# Patient Record
Sex: Female | Born: 1970 | Race: White | Hispanic: No | Marital: Married | Smoking: Never smoker
Health system: Southern US, Community
[De-identification: ages and names within clinical notes are randomized; demographics above are authoritative.]

---

## 1998-07-27 ENCOUNTER — Other Ambulatory Visit: Admission: RE | Admit: 1998-07-27 | Discharge: 1998-07-27 | Payer: Self-pay | Admitting: *Deleted

## 1999-09-02 ENCOUNTER — Other Ambulatory Visit: Admission: RE | Admit: 1999-09-02 | Discharge: 1999-09-02 | Payer: Self-pay | Admitting: *Deleted

## 2001-11-29 ENCOUNTER — Other Ambulatory Visit: Admission: RE | Admit: 2001-11-29 | Discharge: 2001-11-29 | Payer: Self-pay | Admitting: Obstetrics and Gynecology

## 2003-05-01 LAB — TSH: TSH: 1.37 (ref 0.41–5.90)

## 2003-05-27 ENCOUNTER — Other Ambulatory Visit: Admission: RE | Admit: 2003-05-27 | Discharge: 2003-05-27 | Payer: Self-pay | Admitting: Obstetrics and Gynecology

## 2003-09-16 LAB — POCT WET PREP (WET MOUNT)
Trichomonas Wet Prep HPF POC: ABSENT
WBC, Wet Prep HPF POC: NORMAL

## 2003-12-23 ENCOUNTER — Inpatient Hospital Stay (HOSPITAL_COMMUNITY): Admission: AD | Admit: 2003-12-23 | Discharge: 2003-12-26 | Payer: Self-pay | Admitting: Obstetrics and Gynecology

## 2003-12-23 ENCOUNTER — Inpatient Hospital Stay (HOSPITAL_COMMUNITY): Admission: AD | Admit: 2003-12-23 | Discharge: 2003-12-23 | Payer: Self-pay | Admitting: Obstetrics and Gynecology

## 2004-06-14 ENCOUNTER — Other Ambulatory Visit: Admission: RE | Admit: 2004-06-14 | Discharge: 2004-06-14 | Payer: Self-pay | Admitting: Obstetrics and Gynecology

## 2004-07-28 ENCOUNTER — Encounter: Admission: RE | Admit: 2004-07-28 | Discharge: 2004-07-28 | Payer: Self-pay | Admitting: Gastroenterology

## 2005-06-15 ENCOUNTER — Other Ambulatory Visit: Admission: RE | Admit: 2005-06-15 | Discharge: 2005-06-15 | Payer: Self-pay | Admitting: Obstetrics and Gynecology

## 2006-06-26 ENCOUNTER — Other Ambulatory Visit: Admission: RE | Admit: 2006-06-26 | Discharge: 2006-06-26 | Payer: Self-pay | Admitting: Obstetrics and Gynecology

## 2006-08-04 ENCOUNTER — Emergency Department (HOSPITAL_COMMUNITY): Admission: EM | Admit: 2006-08-04 | Discharge: 2006-08-04 | Payer: Self-pay | Admitting: Family Medicine

## 2006-08-14 ENCOUNTER — Emergency Department (HOSPITAL_COMMUNITY): Admission: EM | Admit: 2006-08-14 | Discharge: 2006-08-14 | Payer: Self-pay | Admitting: Family Medicine

## 2006-11-03 ENCOUNTER — Encounter: Admission: RE | Admit: 2006-11-03 | Discharge: 2006-11-03 | Payer: Self-pay | Admitting: Obstetrics and Gynecology

## 2007-12-05 ENCOUNTER — Other Ambulatory Visit: Admission: RE | Admit: 2007-12-05 | Discharge: 2007-12-05 | Payer: Self-pay | Admitting: Obstetrics and Gynecology

## 2007-12-05 LAB — HM PAP SMEAR: HM Pap smear: NEGATIVE

## 2008-05-12 ENCOUNTER — Encounter: Admission: RE | Admit: 2008-05-12 | Discharge: 2008-05-12 | Payer: Self-pay | Admitting: Family Medicine

## 2009-01-14 ENCOUNTER — Encounter: Admission: RE | Admit: 2009-01-14 | Discharge: 2009-01-14 | Payer: Self-pay | Admitting: Obstetrics and Gynecology

## 2009-01-26 ENCOUNTER — Encounter: Admission: RE | Admit: 2009-01-26 | Discharge: 2009-01-26 | Payer: Self-pay | Admitting: Obstetrics and Gynecology

## 2009-04-20 ENCOUNTER — Other Ambulatory Visit: Admission: RE | Admit: 2009-04-20 | Discharge: 2009-04-20 | Payer: Self-pay | Admitting: Obstetrics and Gynecology

## 2010-01-09 ENCOUNTER — Emergency Department (HOSPITAL_COMMUNITY)
Admission: EM | Admit: 2010-01-09 | Discharge: 2010-01-09 | Payer: Self-pay | Source: Home / Self Care | Admitting: Emergency Medicine

## 2010-01-26 ENCOUNTER — Encounter
Admission: RE | Admit: 2010-01-26 | Discharge: 2010-01-26 | Payer: Self-pay | Source: Home / Self Care | Attending: Obstetrics and Gynecology | Admitting: Obstetrics and Gynecology

## 2010-02-14 ENCOUNTER — Encounter: Payer: Self-pay | Admitting: Obstetrics and Gynecology

## 2010-04-21 ENCOUNTER — Other Ambulatory Visit: Payer: Self-pay | Admitting: Obstetrics and Gynecology

## 2010-04-21 ENCOUNTER — Other Ambulatory Visit (HOSPITAL_COMMUNITY)
Admission: RE | Admit: 2010-04-21 | Discharge: 2010-04-21 | Disposition: A | Discharge status: Home / Self Care | Payer: BC Managed Care – PPO | Source: Ambulatory Visit | Attending: Obstetrics and Gynecology | Admitting: Obstetrics and Gynecology

## 2010-04-21 DIAGNOSIS — Z113 Encounter for screening for infections with a predominantly sexual mode of transmission: Secondary | ICD-10-CM | POA: Insufficient documentation

## 2010-04-21 DIAGNOSIS — Z01419 Encounter for gynecological examination (general) (routine) without abnormal findings: Secondary | ICD-10-CM | POA: Insufficient documentation

## 2011-02-17 ENCOUNTER — Other Ambulatory Visit: Payer: Self-pay | Admitting: Obstetrics and Gynecology

## 2011-02-17 DIAGNOSIS — Z1231 Encounter for screening mammogram for malignant neoplasm of breast: Secondary | ICD-10-CM

## 2011-03-01 ENCOUNTER — Ambulatory Visit
Admission: RE | Admit: 2011-03-01 | Discharge: 2011-03-01 | Disposition: A | Discharge status: Home / Self Care | Payer: BC Managed Care – PPO | Source: Ambulatory Visit | Attending: Obstetrics and Gynecology | Admitting: Obstetrics and Gynecology

## 2011-03-01 DIAGNOSIS — Z1231 Encounter for screening mammogram for malignant neoplasm of breast: Secondary | ICD-10-CM

## 2011-05-10 ENCOUNTER — Other Ambulatory Visit: Payer: Self-pay | Admitting: Obstetrics and Gynecology

## 2011-05-10 ENCOUNTER — Other Ambulatory Visit (HOSPITAL_COMMUNITY)
Admission: RE | Admit: 2011-05-10 | Discharge: 2011-05-10 | Disposition: A | Discharge status: Home / Self Care | Payer: BC Managed Care – PPO | Source: Ambulatory Visit | Attending: Obstetrics and Gynecology | Admitting: Obstetrics and Gynecology

## 2011-05-10 DIAGNOSIS — Z01419 Encounter for gynecological examination (general) (routine) without abnormal findings: Secondary | ICD-10-CM | POA: Insufficient documentation

## 2012-04-06 ENCOUNTER — Other Ambulatory Visit: Payer: Self-pay

## 2012-04-06 DIAGNOSIS — Z1231 Encounter for screening mammogram for malignant neoplasm of breast: Secondary | ICD-10-CM

## 2012-05-03 ENCOUNTER — Ambulatory Visit
Admission: RE | Admit: 2012-05-03 | Discharge: 2012-05-03 | Disposition: A | Discharge status: Home / Self Care | Payer: 59 | Source: Ambulatory Visit

## 2012-05-03 DIAGNOSIS — Z1231 Encounter for screening mammogram for malignant neoplasm of breast: Secondary | ICD-10-CM

## 2012-05-09 ENCOUNTER — Other Ambulatory Visit: Payer: Self-pay | Admitting: Obstetrics and Gynecology

## 2012-05-09 ENCOUNTER — Other Ambulatory Visit (HOSPITAL_COMMUNITY)
Admission: RE | Admit: 2012-05-09 | Discharge: 2012-05-09 | Disposition: A | Discharge status: Home / Self Care | Payer: 59 | Source: Ambulatory Visit | Attending: Obstetrics and Gynecology | Admitting: Obstetrics and Gynecology

## 2012-05-09 DIAGNOSIS — Z1151 Encounter for screening for human papillomavirus (HPV): Secondary | ICD-10-CM | POA: Insufficient documentation

## 2012-05-09 DIAGNOSIS — Z01419 Encounter for gynecological examination (general) (routine) without abnormal findings: Secondary | ICD-10-CM | POA: Insufficient documentation

## 2013-05-14 ENCOUNTER — Other Ambulatory Visit: Payer: Self-pay

## 2013-05-14 DIAGNOSIS — Z1231 Encounter for screening mammogram for malignant neoplasm of breast: Secondary | ICD-10-CM

## 2013-05-27 ENCOUNTER — Ambulatory Visit
Admission: RE | Admit: 2013-05-27 | Discharge: 2013-05-27 | Disposition: A | Discharge status: Home / Self Care | Payer: 59 | Source: Ambulatory Visit

## 2013-05-27 ENCOUNTER — Encounter (INDEPENDENT_AMBULATORY_CARE_PROVIDER_SITE_OTHER): Payer: Self-pay

## 2013-05-27 DIAGNOSIS — Z1231 Encounter for screening mammogram for malignant neoplasm of breast: Secondary | ICD-10-CM

## 2014-08-25 ENCOUNTER — Other Ambulatory Visit: Payer: Self-pay

## 2014-08-25 DIAGNOSIS — Z1231 Encounter for screening mammogram for malignant neoplasm of breast: Secondary | ICD-10-CM

## 2014-08-28 ENCOUNTER — Ambulatory Visit
Admission: RE | Admit: 2014-08-28 | Discharge: 2014-08-28 | Disposition: A | Discharge status: Home / Self Care | Payer: 59 | Source: Ambulatory Visit

## 2014-08-28 DIAGNOSIS — Z1231 Encounter for screening mammogram for malignant neoplasm of breast: Secondary | ICD-10-CM

## 2015-06-19 ENCOUNTER — Other Ambulatory Visit (HOSPITAL_COMMUNITY)
Admission: RE | Admit: 2015-06-19 | Discharge: 2015-06-19 | Disposition: A | Discharge status: Home / Self Care | Payer: 59 | Source: Ambulatory Visit | Attending: Obstetrics and Gynecology | Admitting: Obstetrics and Gynecology

## 2015-06-19 ENCOUNTER — Other Ambulatory Visit: Payer: Self-pay | Admitting: Obstetrics and Gynecology

## 2015-06-19 DIAGNOSIS — Z1151 Encounter for screening for human papillomavirus (HPV): Secondary | ICD-10-CM | POA: Insufficient documentation

## 2015-06-19 DIAGNOSIS — Z01419 Encounter for gynecological examination (general) (routine) without abnormal findings: Secondary | ICD-10-CM | POA: Diagnosis present

## 2015-06-23 LAB — CYTOLOGY - PAP

## 2015-10-22 ENCOUNTER — Other Ambulatory Visit: Payer: Self-pay | Admitting: Obstetrics and Gynecology

## 2015-10-22 DIAGNOSIS — Z1231 Encounter for screening mammogram for malignant neoplasm of breast: Secondary | ICD-10-CM

## 2015-10-27 ENCOUNTER — Ambulatory Visit
Admission: RE | Admit: 2015-10-27 | Discharge: 2015-10-27 | Disposition: A | Discharge status: Home / Self Care | Payer: 59 | Source: Ambulatory Visit | Attending: Obstetrics and Gynecology | Admitting: Obstetrics and Gynecology

## 2015-10-27 DIAGNOSIS — Z1231 Encounter for screening mammogram for malignant neoplasm of breast: Secondary | ICD-10-CM

## 2016-12-13 ENCOUNTER — Other Ambulatory Visit: Payer: Self-pay | Admitting: Obstetrics and Gynecology

## 2016-12-13 DIAGNOSIS — Z139 Encounter for screening, unspecified: Secondary | ICD-10-CM

## 2017-01-16 ENCOUNTER — Ambulatory Visit
Admission: RE | Admit: 2017-01-16 | Discharge: 2017-01-16 | Disposition: A | Discharge status: Home / Self Care | Payer: 59 | Source: Ambulatory Visit | Attending: Obstetrics and Gynecology | Admitting: Obstetrics and Gynecology

## 2017-01-16 DIAGNOSIS — Z139 Encounter for screening, unspecified: Secondary | ICD-10-CM

## 2018-09-25 ENCOUNTER — Other Ambulatory Visit (HOSPITAL_COMMUNITY)
Admission: RE | Admit: 2018-09-25 | Discharge: 2018-09-25 | Disposition: A | Discharge status: Home / Self Care | Payer: 59 | Source: Ambulatory Visit | Attending: Obstetrics and Gynecology | Admitting: Obstetrics and Gynecology

## 2018-09-25 ENCOUNTER — Other Ambulatory Visit: Payer: Self-pay | Admitting: Obstetrics and Gynecology

## 2018-09-25 DIAGNOSIS — Z01419 Encounter for gynecological examination (general) (routine) without abnormal findings: Secondary | ICD-10-CM | POA: Insufficient documentation

## 2018-09-26 LAB — CYTOLOGY - PAP
Diagnosis: NEGATIVE
HPV: NOT DETECTED

## 2018-12-28 ENCOUNTER — Other Ambulatory Visit: Payer: Self-pay | Admitting: Obstetrics and Gynecology

## 2018-12-28 DIAGNOSIS — Z1231 Encounter for screening mammogram for malignant neoplasm of breast: Secondary | ICD-10-CM

## 2019-01-05 ENCOUNTER — Ambulatory Visit
Admission: RE | Admit: 2019-01-05 | Discharge: 2019-01-05 | Disposition: A | Discharge status: Home / Self Care | Payer: 59 | Source: Ambulatory Visit

## 2019-01-05 ENCOUNTER — Other Ambulatory Visit: Payer: Self-pay

## 2019-01-05 DIAGNOSIS — Z1231 Encounter for screening mammogram for malignant neoplasm of breast: Secondary | ICD-10-CM

## 2019-04-17 LAB — HM COLONOSCOPY

## 2019-09-26 DIAGNOSIS — Z01419 Encounter for gynecological examination (general) (routine) without abnormal findings: Secondary | ICD-10-CM | POA: Diagnosis not present

## 2019-09-26 DIAGNOSIS — N939 Abnormal uterine and vaginal bleeding, unspecified: Secondary | ICD-10-CM | POA: Diagnosis not present

## 2019-09-26 DIAGNOSIS — N926 Irregular menstruation, unspecified: Secondary | ICD-10-CM | POA: Diagnosis not present

## 2019-09-26 LAB — TSH: TSH: 1.86 (ref 0.41–5.90)

## 2019-10-08 DIAGNOSIS — N950 Postmenopausal bleeding: Secondary | ICD-10-CM | POA: Diagnosis not present

## 2019-10-08 DIAGNOSIS — D72819 Decreased white blood cell count, unspecified: Secondary | ICD-10-CM | POA: Diagnosis not present

## 2019-10-22 DIAGNOSIS — D72819 Decreased white blood cell count, unspecified: Secondary | ICD-10-CM | POA: Diagnosis not present

## 2020-01-29 DIAGNOSIS — D2272 Melanocytic nevi of left lower limb, including hip: Secondary | ICD-10-CM | POA: Diagnosis not present

## 2020-01-29 DIAGNOSIS — D1801 Hemangioma of skin and subcutaneous tissue: Secondary | ICD-10-CM | POA: Diagnosis not present

## 2020-01-29 DIAGNOSIS — D225 Melanocytic nevi of trunk: Secondary | ICD-10-CM | POA: Diagnosis not present

## 2020-01-29 DIAGNOSIS — L738 Other specified follicular disorders: Secondary | ICD-10-CM | POA: Diagnosis not present

## 2020-03-20 ENCOUNTER — Other Ambulatory Visit: Payer: Self-pay | Admitting: Obstetrics and Gynecology

## 2020-03-20 DIAGNOSIS — Z1231 Encounter for screening mammogram for malignant neoplasm of breast: Secondary | ICD-10-CM

## 2020-04-23 ENCOUNTER — Other Ambulatory Visit: Payer: Self-pay

## 2020-04-23 ENCOUNTER — Ambulatory Visit
Admission: RE | Admit: 2020-04-23 | Discharge: 2020-04-23 | Disposition: A | Discharge status: Home / Self Care | Payer: 59 | Source: Ambulatory Visit

## 2020-04-23 DIAGNOSIS — Z1231 Encounter for screening mammogram for malignant neoplasm of breast: Secondary | ICD-10-CM | POA: Diagnosis not present

## 2020-04-27 ENCOUNTER — Other Ambulatory Visit: Payer: Self-pay | Admitting: Obstetrics and Gynecology

## 2020-04-27 DIAGNOSIS — R928 Other abnormal and inconclusive findings on diagnostic imaging of breast: Secondary | ICD-10-CM

## 2020-05-01 ENCOUNTER — Ambulatory Visit
Admission: RE | Admit: 2020-05-01 | Discharge: 2020-05-01 | Disposition: A | Discharge status: Home / Self Care | Payer: BC Managed Care – PPO | Source: Ambulatory Visit | Attending: Obstetrics and Gynecology | Admitting: Obstetrics and Gynecology

## 2020-05-01 ENCOUNTER — Other Ambulatory Visit: Payer: Self-pay

## 2020-05-01 DIAGNOSIS — N6002 Solitary cyst of left breast: Secondary | ICD-10-CM | POA: Diagnosis not present

## 2020-05-01 DIAGNOSIS — R928 Other abnormal and inconclusive findings on diagnostic imaging of breast: Secondary | ICD-10-CM

## 2020-12-23 ENCOUNTER — Other Ambulatory Visit: Payer: Self-pay | Admitting: Obstetrics and Gynecology

## 2020-12-23 DIAGNOSIS — N6321 Unspecified lump in the left breast, upper outer quadrant: Secondary | ICD-10-CM

## 2021-02-01 ENCOUNTER — Other Ambulatory Visit: Payer: Self-pay | Admitting: Obstetrics and Gynecology

## 2021-02-01 DIAGNOSIS — Z1231 Encounter for screening mammogram for malignant neoplasm of breast: Secondary | ICD-10-CM

## 2021-02-03 ENCOUNTER — Other Ambulatory Visit: Payer: BC Managed Care – PPO

## 2021-04-23 ENCOUNTER — Ambulatory Visit
Admission: RE | Admit: 2021-04-23 | Discharge: 2021-04-23 | Disposition: A | Discharge status: Home / Self Care | Payer: No Typology Code available for payment source | Source: Ambulatory Visit | Attending: Obstetrics and Gynecology | Admitting: Obstetrics and Gynecology

## 2021-04-23 DIAGNOSIS — Z1231 Encounter for screening mammogram for malignant neoplasm of breast: Secondary | ICD-10-CM

## 2021-05-25 ENCOUNTER — Other Ambulatory Visit (HOSPITAL_COMMUNITY)
Admission: RE | Admit: 2021-05-25 | Discharge: 2021-05-25 | Disposition: A | Discharge status: Home / Self Care | Payer: No Typology Code available for payment source | Source: Ambulatory Visit | Attending: Obstetrics and Gynecology | Admitting: Obstetrics and Gynecology

## 2021-05-25 DIAGNOSIS — Z01419 Encounter for gynecological examination (general) (routine) without abnormal findings: Secondary | ICD-10-CM | POA: Insufficient documentation

## 2021-05-28 LAB — CYTOLOGY - PAP
Comment: NEGATIVE
Diagnosis: UNDETERMINED — AB
High risk HPV: NEGATIVE

## 2022-03-14 ENCOUNTER — Other Ambulatory Visit: Payer: Self-pay | Admitting: Obstetrics and Gynecology

## 2022-03-14 DIAGNOSIS — Z1231 Encounter for screening mammogram for malignant neoplasm of breast: Secondary | ICD-10-CM

## 2022-03-19 DIAGNOSIS — Z6823 Body mass index (BMI) 23.0-23.9, adult: Secondary | ICD-10-CM | POA: Diagnosis not present

## 2022-03-19 DIAGNOSIS — Z713 Dietary counseling and surveillance: Secondary | ICD-10-CM | POA: Diagnosis not present

## 2022-03-19 DIAGNOSIS — R030 Elevated blood-pressure reading, without diagnosis of hypertension: Secondary | ICD-10-CM | POA: Diagnosis not present

## 2022-03-19 DIAGNOSIS — Z131 Encounter for screening for diabetes mellitus: Secondary | ICD-10-CM | POA: Diagnosis not present

## 2022-03-19 DIAGNOSIS — Z13220 Encounter for screening for lipoid disorders: Secondary | ICD-10-CM | POA: Diagnosis not present

## 2022-04-04 DIAGNOSIS — D2272 Melanocytic nevi of left lower limb, including hip: Secondary | ICD-10-CM | POA: Diagnosis not present

## 2022-04-04 DIAGNOSIS — L570 Actinic keratosis: Secondary | ICD-10-CM | POA: Diagnosis not present

## 2022-04-04 DIAGNOSIS — D224 Melanocytic nevi of scalp and neck: Secondary | ICD-10-CM | POA: Diagnosis not present

## 2022-04-04 DIAGNOSIS — D225 Melanocytic nevi of trunk: Secondary | ICD-10-CM | POA: Diagnosis not present

## 2022-04-04 DIAGNOSIS — D2262 Melanocytic nevi of left upper limb, including shoulder: Secondary | ICD-10-CM | POA: Diagnosis not present

## 2022-04-26 ENCOUNTER — Ambulatory Visit
Admission: RE | Admit: 2022-04-26 | Discharge: 2022-04-26 | Disposition: A | Discharge status: Home / Self Care | Payer: BC Managed Care – PPO | Source: Ambulatory Visit

## 2022-04-26 DIAGNOSIS — Z1231 Encounter for screening mammogram for malignant neoplasm of breast: Secondary | ICD-10-CM

## 2022-05-31 ENCOUNTER — Other Ambulatory Visit (HOSPITAL_COMMUNITY)
Admission: RE | Admit: 2022-05-31 | Discharge: 2022-05-31 | Disposition: A | Discharge status: Home / Self Care | Payer: BC Managed Care – PPO | Source: Ambulatory Visit | Attending: Obstetrics and Gynecology | Admitting: Obstetrics and Gynecology

## 2022-05-31 ENCOUNTER — Other Ambulatory Visit: Payer: Self-pay | Admitting: Obstetrics and Gynecology

## 2022-05-31 DIAGNOSIS — Z01419 Encounter for gynecological examination (general) (routine) without abnormal findings: Secondary | ICD-10-CM | POA: Insufficient documentation

## 2022-06-03 LAB — CYTOLOGY - PAP
Comment: NEGATIVE
Diagnosis: NEGATIVE
High risk HPV: NEGATIVE

## 2022-06-14 DIAGNOSIS — Z713 Dietary counseling and surveillance: Secondary | ICD-10-CM | POA: Diagnosis not present

## 2022-06-16 DIAGNOSIS — Z713 Dietary counseling and surveillance: Secondary | ICD-10-CM | POA: Diagnosis not present

## 2022-07-07 DIAGNOSIS — Z713 Dietary counseling and surveillance: Secondary | ICD-10-CM | POA: Diagnosis not present

## 2022-07-13 ENCOUNTER — Encounter: Payer: Self-pay | Admitting: Family Medicine

## 2022-07-13 ENCOUNTER — Ambulatory Visit (INDEPENDENT_AMBULATORY_CARE_PROVIDER_SITE_OTHER): Payer: BC Managed Care – PPO | Admitting: Family Medicine

## 2022-07-13 VITALS — BP 122/76 | HR 70 | Temp 97.6°F | Ht 67.0 in | Wt 155.0 lb

## 2022-07-13 DIAGNOSIS — Z7185 Encounter for immunization safety counseling: Secondary | ICD-10-CM | POA: Diagnosis not present

## 2022-07-13 DIAGNOSIS — Z0001 Encounter for general adult medical examination with abnormal findings: Secondary | ICD-10-CM

## 2022-07-13 DIAGNOSIS — Z7689 Persons encountering health services in other specified circumstances: Secondary | ICD-10-CM

## 2022-07-13 DIAGNOSIS — Z1159 Encounter for screening for other viral diseases: Secondary | ICD-10-CM | POA: Diagnosis not present

## 2022-07-13 DIAGNOSIS — Z833 Family history of diabetes mellitus: Secondary | ICD-10-CM | POA: Insufficient documentation

## 2022-07-13 DIAGNOSIS — E785 Hyperlipidemia, unspecified: Secondary | ICD-10-CM | POA: Diagnosis not present

## 2022-07-13 DIAGNOSIS — R002 Palpitations: Secondary | ICD-10-CM | POA: Diagnosis not present

## 2022-07-13 DIAGNOSIS — H60543 Acute eczematoid otitis externa, bilateral: Secondary | ICD-10-CM

## 2022-07-13 DIAGNOSIS — Z0000 Encounter for general adult medical examination without abnormal findings: Secondary | ICD-10-CM | POA: Diagnosis not present

## 2022-07-13 DIAGNOSIS — R32 Unspecified urinary incontinence: Secondary | ICD-10-CM | POA: Diagnosis not present

## 2022-07-13 HISTORY — DX: Hyperlipidemia, unspecified: E78.5

## 2022-07-13 HISTORY — DX: Encounter for general adult medical examination with abnormal findings: Z00.01

## 2022-07-13 LAB — COMPREHENSIVE METABOLIC PANEL
ALT: 15 U/L (ref 0–35)
AST: 13 U/L (ref 0–37)
Albumin: 4.5 g/dL (ref 3.5–5.2)
Alkaline Phosphatase: 42 U/L (ref 39–117)
BUN: 19 mg/dL (ref 6–23)
CO2: 29 mEq/L (ref 19–32)
Calcium: 9.6 mg/dL (ref 8.4–10.5)
Chloride: 103 mEq/L (ref 96–112)
Creatinine, Ser: 0.67 mg/dL (ref 0.40–1.20)
GFR: 99.56 mL/min (ref >60.00)
Glucose, Bld: 99 mg/dL (ref 70–99)
Potassium: 4.8 mEq/L (ref 3.5–5.1)
Sodium: 140 mEq/L (ref 135–145)
Total Bilirubin: 0.4 mg/dL (ref 0.2–1.2)
Total Protein: 7.7 g/dL (ref 6.0–8.3)

## 2022-07-13 LAB — CBC WITH DIFFERENTIAL/PLATELET
Basophils Absolute: 0 10*3/uL (ref 0.0–0.1)
Basophils Relative: 1.1 % (ref 0.0–3.0)
Eosinophils Absolute: 0 10*3/uL (ref 0.0–0.7)
Eosinophils Relative: 0.8 % (ref 0.0–5.0)
HCT: 41.5 % (ref 36.0–46.0)
Hemoglobin: 13.6 g/dL (ref 12.0–15.0)
Lymphocytes Relative: 46 % (ref 12.0–46.0)
Lymphs Abs: 1.7 10*3/uL (ref 0.7–4.0)
MCHC: 32.8 g/dL (ref 30.0–36.0)
MCV: 95.4 fl (ref 78.0–100.0)
Monocytes Absolute: 0.3 10*3/uL (ref 0.1–1.0)
Monocytes Relative: 7 % (ref 3.0–12.0)
Neutro Abs: 1.7 10*3/uL (ref 1.4–7.7)
Neutrophils Relative %: 45.1 % (ref 43.0–77.0)
Platelets: 251 10*3/uL (ref 150.0–400.0)
RBC: 4.35 Mil/uL (ref 3.87–5.11)
RDW: 13.5 % (ref 11.5–15.5)
WBC: 3.8 10*3/uL — ABNORMAL LOW (ref 4.0–10.5)

## 2022-07-13 LAB — T4, FREE: Free T4: 0.89 ng/dL (ref 0.60–1.60)

## 2022-07-13 LAB — LIPID PANEL
Cholesterol: 232 mg/dL — ABNORMAL HIGH (ref 0–200)
HDL: 57.5 mg/dL (ref >39.00)
LDL Cholesterol: 160 mg/dL — ABNORMAL HIGH (ref 0–99)
NonHDL: 174.26
Total CHOL/HDL Ratio: 4
Triglycerides: 72 mg/dL (ref 0.0–149.0)
VLDL: 14.4 mg/dL (ref 0.0–40.0)

## 2022-07-13 LAB — TSH: TSH: 1.8 u[IU]/mL (ref 0.35–5.50)

## 2022-07-13 MED ORDER — HYDROCORTISONE-ACETIC ACID 1-2 % OT SOLN
4.0000 [drp] | Freq: Two times a day (BID) | OTIC | 0 refills | Status: DC
Start: 2022-07-13 — End: 2022-07-14

## 2022-07-13 NOTE — Patient Instructions (Signed)
Thank you for trusting [place] with your healthcare.   Please go downstairs for labs before you leave.  Use the eardrops as prescribed and follow-up if you are not getting better.  You should hear from a physical therapy office in the next week.  Let me know if you do not.  We will be in touch with your results and with recommendations.

## 2022-07-13 NOTE — Progress Notes (Signed)
Complete physical exam  Patient: Sherry Harper   DOB: September 13, 1970   52 y.o. Female  MRN: [id number]  Subjective:    Chief Complaint  Patient presents with   Establish Care    Bilateral ears feeling itchy and like there is water in them for a year   She is new to the practice and here for a complete physical exam.  OB/GYN- Gerald Leitz at [place]  Dermatologist- [facility] [place]- colonoscopy 2021   UTD on mammogram and pap smear  UTD with colonoscopy.   C/o pruritic ear canals, chronic. No ear pain or decreased hearing.   Long hx of palpitations, lasts only a couple of seconds. No chest pain, syncope or shob.   Reports elevated lipids in past.    Works for a Social worker form, desk job.  Rides an indoor bike, walks.  Healthy diet      Health Maintenance  Topic Date Due   COVID-19 Vaccine (1) Never done   HIV Screening  Never done   Hepatitis C Screening  Never done   DTaP/Tdap/Td vaccine (1 - Tdap) Never done   Colon Cancer Screening  Never done   Zoster (Shingles) Vaccine (1 of 2) Never done   Flu Shot  08/25/2022   Mammogram  04/25/2024   Pap Smear  05/30/2025   HPV Vaccine  Aged Out    Wears seatbelt always, uses sunscreen, smoke detectors in home and functioning, does not text while driving, feels safe in home environment.  Depression screening:    07/13/2022    8:00 AM  Depression screen PHQ 2/9  Decreased Interest 0  Down, Depressed, Hopeless 0  PHQ - 2 Score 0   Anxiety Screening:     No data to display          Vision:Within last year and Dental: No current dental problems and Receives regular dental care  Patient Active Problem List   Diagnosis Date Noted   Family history of diabetes mellitus in first degree relative 07/13/2022   Intermittent palpitations 07/13/2022   Urinary incontinence 07/13/2022   Hyperlipidemia 07/13/2022   Encounter for general adult medical examination with abnormal findings 07/13/2022   Past Medical History:  Diagnosis  Date   Encounter for general adult medical examination with abnormal findings 07/13/2022   Hyperlipidemia 07/13/2022   History reviewed. No pertinent surgical history. Social History   Tobacco Use   Smoking status: Never   Smokeless tobacco: Never  Substance Use Topics   Alcohol use: Not Currently    Comment: Rarely - 1-2 drinks per year   Drug use: Never      Patient Care Team: Avanell Shackleton, NP-C as PCP - General (Family Medicine) Gerald Leitz, MD as Consulting Physician (Obstetrics and Gynecology)   Outpatient Medications Prior to Visit  Medication Sig   Cholecalciferol (VITAMIN D-3 PO) daily.   MAGNESIUM PO 125 mg daily.   Omega 3 1000 MG CAPS 1 capsule Orally Once a day   No facility-administered medications prior to visit.    Review of Systems  Constitutional:  Negative for chills, fever, malaise/fatigue and weight loss.  HENT:  Positive for ear pain. Negative for congestion, sinus pain and sore throat.        External canal itching  Eyes:  Negative for blurred vision, double vision and pain.  Respiratory:  Negative for cough, shortness of breath and wheezing.   Cardiovascular:  Positive for palpitations. Negative for chest pain and leg swelling.  Gastrointestinal:  Negative for abdominal pain, constipation, diarrhea, nausea and vomiting.  Genitourinary:  Negative for dysuria, frequency and urgency.  Musculoskeletal:  Negative for back pain, joint pain and myalgias.  Skin:  Negative for rash.  Neurological:  Negative for dizziness, tingling, focal weakness and headaches.  Endo/Heme/Allergies:  Does not bruise/bleed easily.  Psychiatric/Behavioral:  Negative for depression, memory loss and suicidal ideas. The patient is not nervous/anxious.        Objective:    BP 122/76 (BP Location: Left Arm, Patient Position: Sitting, Cuff Size: Large)   Pulse 70   Temp 97.6 F (36.4 C) (Temporal)   Ht 5\' 7"  (1.702 m)   Wt 155 lb (70.3 kg)   SpO2 100%   BMI 24.28 kg/m   BP Readings from Last 3 Encounters:  07/13/22 122/76   Wt Readings from Last 3 Encounters:  07/13/22 155 lb (70.3 kg)    Physical Exam Constitutional:      General: She is not in acute distress.    Appearance: She is not ill-appearing.  HENT:     Right Ear: Tympanic membrane and external ear normal.     Left Ear: Tympanic membrane and external ear normal.     Ears:     Comments: Dry, flaky ear canals, with mild edema on right    Nose: Nose normal.     Mouth/Throat:     Mouth: Mucous membranes are moist.     Pharynx: Oropharynx is clear.  Eyes:     Extraocular Movements: Extraocular movements intact.     Conjunctiva/sclera: Conjunctivae normal.     Pupils: Pupils are equal, round, and reactive to light.  Neck:     Thyroid: No thyroid mass, thyromegaly or thyroid tenderness.  Cardiovascular:     Rate and Rhythm: Normal rate and regular rhythm.     Pulses: Normal pulses.     Heart sounds: Normal heart sounds.  Pulmonary:     Effort: Pulmonary effort is normal.     Breath sounds: Normal breath sounds.  Abdominal:     General: Bowel sounds are normal.     Palpations: Abdomen is soft.     Tenderness: There is no abdominal tenderness. There is no right CVA tenderness, left CVA tenderness, guarding or rebound.  Musculoskeletal:        General: Normal range of motion.     Cervical back: Normal range of motion and neck supple. No tenderness.     Right lower leg: No edema.     Left lower leg: No edema.  Lymphadenopathy:     Cervical: No cervical adenopathy.  Skin:    General: Skin is warm and dry.     Findings: No lesion or rash.  Neurological:     General: No focal deficit present.     Mental Status: She is alert and oriented to person, place, and time.     Cranial Nerves: No cranial nerve deficit.     Sensory: No sensory deficit.     Motor: No weakness.     Gait: Gait normal.  Psychiatric:        Mood and Affect: Mood normal.        Behavior: Behavior normal.         Thought Content: Thought content normal.      Results for orders placed or performed in visit on 07/13/22  Lipid panel  Result Value Ref Range   Cholesterol 232 (H) 0 - 200 mg/dL   Triglycerides 08.6 0.0 - 149.0 mg/dL  HDL 57.50 >39.00 mg/dL   VLDL 40.9 0.0 - 81.1 mg/dL   LDL Cholesterol 914 (H) 0 - 99 mg/dL   Total CHOL/HDL Ratio 4    NonHDL 174.26   T4, free  Result Value Ref Range   Free T4 0.89 0.60 - 1.60 ng/dL  TSH  Result Value Ref Range   TSH 1.80 0.35 - 5.50 uIU/mL  Comprehensive metabolic panel  Result Value Ref Range   Sodium 140 135 - 145 mEq/L   Potassium 4.8 3.5 - 5.1 mEq/L   Chloride 103 96 - 112 mEq/L   CO2 29 19 - 32 mEq/L   Glucose, Bld 99 70 - 99 mg/dL   BUN 19 6 - 23 mg/dL   Creatinine, Ser 7.82 0.40 - 1.20 mg/dL   Total Bilirubin 0.4 0.2 - 1.2 mg/dL   Alkaline Phosphatase 42 39 - 117 U/L   AST 13 0 - 37 U/L   ALT 15 0 - 35 U/L   Total Protein 7.7 6.0 - 8.3 g/dL   Albumin 4.5 3.5 - 5.2 g/dL   GFR 95.62 >13.08 mL/min   Calcium 9.6 8.4 - 10.5 mg/dL  CBC with Differential/Platelet  Result Value Ref Range   WBC 3.8 (L) 4.0 - 10.5 K/uL   RBC 4.35 3.87 - 5.11 Mil/uL   Hemoglobin 13.6 12.0 - 15.0 g/dL   HCT 65.7 84.6 - 96.2 %   MCV 95.4 78.0 - 100.0 fl   MCHC 32.8 30.0 - 36.0 g/dL   RDW 95.2 84.1 - 32.4 %   Platelets 251.0 150.0 - 400.0 K/uL   Neutrophils Relative % 45.1 43.0 - 77.0 %   Lymphocytes Relative 46.0 12.0 - 46.0 %   Monocytes Relative 7.0 3.0 - 12.0 %   Eosinophils Relative 0.8 0.0 - 5.0 %   Basophils Relative 1.1 0.0 - 3.0 %   Neutro Abs 1.7 1.4 - 7.7 K/uL   Lymphs Abs 1.7 0.7 - 4.0 K/uL   Monocytes Absolute 0.3 0.1 - 1.0 K/uL   Eosinophils Absolute 0.0 0.0 - 0.7 K/uL   Basophils Absolute 0.0 0.0 - 0.1 K/uL      Assessment & Plan:    Routine Health Maintenance and Physical Exam  Problem List Items Addressed This Visit       Other   Encounter for general adult medical examination with abnormal findings - Primary   Relevant  Orders   CBC with Differential/Platelet (Completed)   Comprehensive metabolic panel (Completed)   Hyperlipidemia   Relevant Orders   Lipid panel (Completed)   Intermittent palpitations   Relevant Orders   TSH (Completed)   T4, free (Completed)   Urinary incontinence   Relevant Orders   Ambulatory referral to Physical Therapy   Other Visit Diagnoses     Encounter to establish care       Encounter for screening for other viral diseases       Relevant Orders   Hepatitis C antibody   HIV Antibody (routine testing w rflx)   Immunization counseling       Dermatitis of ear canal, bilateral       Relevant Medications   acetic acid-hydrocortisone (VOSOL-HC) OTIC solution      She is a pleasant 52 year old female who is new to the practice and here today for a fasting CPE. Preventive health care reviewed. Request OB/GYN records as well as [facility].  Counseling on healthy lifestyle including diet and exercise.  Recommend regular dental and eye exams.  Immunizations reviewed.  Discussed safety.  History of palpitations, not worsening.  Check labs including thyroid function.  Follow-up if worsening or if she would like to pursue a workup for this. VoSol-HC otic solution prescribed.  Follow-up if no improvement  Return in about 1 year (around 07/13/2023) for fasting CPE.     Hetty Blend, NP-C

## 2022-07-14 ENCOUNTER — Telehealth: Payer: Self-pay | Admitting: Family Medicine

## 2022-07-14 ENCOUNTER — Other Ambulatory Visit: Payer: Self-pay | Admitting: Family Medicine

## 2022-07-14 LAB — HIV ANTIBODY (ROUTINE TESTING W REFLEX): HIV 1&2 Ab, 4th Generation: NONREACTIVE

## 2022-07-14 LAB — HEPATITIS C ANTIBODY: Hepatitis C Ab: NONREACTIVE

## 2022-07-14 MED ORDER — NEOMYCIN-POLYMYXIN-HC 3.5-10000-1 OT SOLN: 4.0000 [drp] | Freq: Four times a day (QID) | OTIC | 0 refills | Status: DC

## 2022-07-14 NOTE — Telephone Encounter (Signed)
Pt needs alternative ear drops sent in

## 2022-07-14 NOTE — Telephone Encounter (Signed)
Pt called wanting to know if she can get a alternative Rx for ear drops because the current one she was prescribed is 170 Dollars. Please advise.

## 2022-07-14 NOTE — Telephone Encounter (Signed)
Called and notified pt, she verbalized understanding.

## 2022-08-05 DIAGNOSIS — Z713 Dietary counseling and surveillance: Secondary | ICD-10-CM | POA: Diagnosis not present

## 2022-08-18 DIAGNOSIS — Z713 Dietary counseling and surveillance: Secondary | ICD-10-CM | POA: Diagnosis not present

## 2022-09-13 ENCOUNTER — Encounter: Payer: BC Managed Care – PPO | Admitting: Physical Therapy

## 2022-09-15 DIAGNOSIS — Z713 Dietary counseling and surveillance: Secondary | ICD-10-CM | POA: Diagnosis not present

## 2022-09-27 ENCOUNTER — Encounter: Payer: BC Managed Care – PPO | Admitting: Physical Therapy

## 2022-10-04 ENCOUNTER — Encounter: Payer: BC Managed Care – PPO | Admitting: Physical Therapy

## 2022-10-11 ENCOUNTER — Encounter: Payer: BC Managed Care – PPO | Admitting: Physical Therapy

## 2022-10-13 DIAGNOSIS — Z713 Dietary counseling and surveillance: Secondary | ICD-10-CM | POA: Diagnosis not present

## 2023-04-18 DIAGNOSIS — Z6826 Body mass index (BMI) 26.0-26.9, adult: Secondary | ICD-10-CM | POA: Diagnosis not present

## 2023-04-18 DIAGNOSIS — Z713 Dietary counseling and surveillance: Secondary | ICD-10-CM | POA: Diagnosis not present

## 2023-05-04 ENCOUNTER — Other Ambulatory Visit: Payer: Self-pay | Admitting: Obstetrics and Gynecology

## 2023-05-04 DIAGNOSIS — Z713 Dietary counseling and surveillance: Secondary | ICD-10-CM | POA: Diagnosis not present

## 2023-05-04 DIAGNOSIS — Z1231 Encounter for screening mammogram for malignant neoplasm of breast: Secondary | ICD-10-CM

## 2023-05-22 ENCOUNTER — Ambulatory Visit
Admission: RE | Admit: 2023-05-22 | Discharge: 2023-05-22 | Disposition: A | Discharge status: Home / Self Care | Source: Ambulatory Visit

## 2023-05-22 DIAGNOSIS — Z713 Dietary counseling and surveillance: Secondary | ICD-10-CM | POA: Diagnosis not present

## 2023-05-22 DIAGNOSIS — Z1231 Encounter for screening mammogram for malignant neoplasm of breast: Secondary | ICD-10-CM | POA: Diagnosis not present

## 2023-06-13 DIAGNOSIS — D225 Melanocytic nevi of trunk: Secondary | ICD-10-CM | POA: Diagnosis not present

## 2023-06-13 DIAGNOSIS — D2239 Melanocytic nevi of other parts of face: Secondary | ICD-10-CM | POA: Diagnosis not present

## 2023-06-13 DIAGNOSIS — D2272 Melanocytic nevi of left lower limb, including hip: Secondary | ICD-10-CM | POA: Diagnosis not present

## 2023-06-13 DIAGNOSIS — L821 Other seborrheic keratosis: Secondary | ICD-10-CM | POA: Diagnosis not present

## 2023-06-27 DIAGNOSIS — Z6826 Body mass index (BMI) 26.0-26.9, adult: Secondary | ICD-10-CM | POA: Diagnosis not present

## 2023-06-27 DIAGNOSIS — Z713 Dietary counseling and surveillance: Secondary | ICD-10-CM | POA: Diagnosis not present

## 2023-07-25 DIAGNOSIS — Z6826 Body mass index (BMI) 26.0-26.9, adult: Secondary | ICD-10-CM | POA: Diagnosis not present

## 2023-07-25 DIAGNOSIS — Z713 Dietary counseling and surveillance: Secondary | ICD-10-CM | POA: Diagnosis not present

## 2023-08-14 ENCOUNTER — Ambulatory Visit: Payer: Self-pay | Admitting: Family Medicine

## 2023-08-14 ENCOUNTER — Ambulatory Visit: Attending: Family Medicine

## 2023-08-14 ENCOUNTER — Encounter: Payer: Self-pay | Admitting: Family Medicine

## 2023-08-14 ENCOUNTER — Ambulatory Visit (INDEPENDENT_AMBULATORY_CARE_PROVIDER_SITE_OTHER): Admitting: Family Medicine

## 2023-08-14 VITALS — BP 128/82 | HR 65 | Temp 98.1°F | Ht 67.0 in | Wt 165.1 lb

## 2023-08-14 DIAGNOSIS — R197 Diarrhea, unspecified: Secondary | ICD-10-CM | POA: Insufficient documentation

## 2023-08-14 DIAGNOSIS — R002 Palpitations: Secondary | ICD-10-CM

## 2023-08-14 DIAGNOSIS — R252 Cramp and spasm: Secondary | ICD-10-CM

## 2023-08-14 DIAGNOSIS — Z833 Family history of diabetes mellitus: Secondary | ICD-10-CM

## 2023-08-14 DIAGNOSIS — Z7185 Encounter for immunization safety counseling: Secondary | ICD-10-CM | POA: Diagnosis not present

## 2023-08-14 DIAGNOSIS — R635 Abnormal weight gain: Secondary | ICD-10-CM | POA: Diagnosis not present

## 2023-08-14 DIAGNOSIS — E7800 Pure hypercholesterolemia, unspecified: Secondary | ICD-10-CM | POA: Diagnosis not present

## 2023-08-14 DIAGNOSIS — Z0001 Encounter for general adult medical examination with abnormal findings: Secondary | ICD-10-CM | POA: Diagnosis not present

## 2023-08-14 LAB — CBC WITH DIFFERENTIAL/PLATELET
Basophils Absolute: 0 K/uL (ref 0.0–0.1)
Basophils Relative: 0.9 % (ref 0.0–3.0)
Eosinophils Absolute: 0 K/uL (ref 0.0–0.7)
Eosinophils Relative: 0.6 % (ref 0.0–5.0)
HCT: 40.8 % (ref 36.0–46.0)
Hemoglobin: 13.8 g/dL (ref 12.0–15.0)
Lymphocytes Relative: 45.3 % (ref 12.0–46.0)
Lymphs Abs: 1.7 K/uL (ref 0.7–4.0)
MCHC: 33.8 g/dL (ref 30.0–36.0)
MCV: 93.3 fl (ref 78.0–100.0)
Monocytes Absolute: 0.2 K/uL (ref 0.1–1.0)
Monocytes Relative: 6.3 % (ref 3.0–12.0)
Neutro Abs: 1.8 K/uL (ref 1.4–7.7)
Neutrophils Relative %: 46.9 % (ref 43.0–77.0)
Platelets: 252 K/uL (ref 150.0–400.0)
RBC: 4.38 Mil/uL (ref 3.87–5.11)
RDW: 13.6 % (ref 11.5–15.5)
WBC: 3.9 K/uL — ABNORMAL LOW (ref 4.0–10.5)

## 2023-08-14 LAB — LIPID PANEL
Cholesterol: 239 mg/dL — ABNORMAL HIGH (ref 0–200)
HDL: 55.2 mg/dL (ref >39.00)
LDL Cholesterol: 160 mg/dL — ABNORMAL HIGH (ref 0–99)
NonHDL: 183.62
Total CHOL/HDL Ratio: 4
Triglycerides: 116 mg/dL (ref 0.0–149.0)
VLDL: 23.2 mg/dL (ref 0.0–40.0)

## 2023-08-14 LAB — URINALYSIS, ROUTINE W REFLEX MICROSCOPIC
Bilirubin Urine: NEGATIVE
Hgb urine dipstick: NEGATIVE
Ketones, ur: NEGATIVE
Leukocytes,Ua: NEGATIVE
Nitrite: NEGATIVE
RBC / HPF: NONE SEEN (ref 0–?)
Specific Gravity, Urine: 1.01 (ref 1.000–1.030)
Total Protein, Urine: NEGATIVE
Urine Glucose: NEGATIVE
Urobilinogen, UA: 0.2 (ref 0.0–1.0)
WBC, UA: NONE SEEN (ref 0–?)
pH: 7 (ref 5.0–8.0)

## 2023-08-14 LAB — COMPREHENSIVE METABOLIC PANEL WITH GFR
ALT: 13 U/L (ref 0–35)
AST: 14 U/L (ref 0–37)
Albumin: 4.5 g/dL (ref 3.5–5.2)
Alkaline Phosphatase: 61 U/L (ref 39–117)
BUN: 12 mg/dL (ref 6–23)
CO2: 29 meq/L (ref 19–32)
Calcium: 9.6 mg/dL (ref 8.4–10.5)
Chloride: 103 meq/L (ref 96–112)
Creatinine, Ser: 0.6 mg/dL (ref 0.40–1.20)
GFR: 101.47 mL/min (ref >60.00)
Glucose, Bld: 102 mg/dL — ABNORMAL HIGH (ref 70–99)
Potassium: 4 meq/L (ref 3.5–5.1)
Sodium: 140 meq/L (ref 135–145)
Total Bilirubin: 0.5 mg/dL (ref 0.2–1.2)
Total Protein: 7.3 g/dL (ref 6.0–8.3)

## 2023-08-14 LAB — MAGNESIUM: Magnesium: 2.2 mg/dL (ref 1.5–2.5)

## 2023-08-14 LAB — FERRITIN: Ferritin: 50.6 ng/mL (ref 10.0–291.0)

## 2023-08-14 LAB — TSH: TSH: 1.42 u[IU]/mL (ref 0.35–5.50)

## 2023-08-14 LAB — VITAMIN B12: Vitamin B-12: 434 pg/mL (ref 211–911)

## 2023-08-14 LAB — T4, FREE: Free T4: 0.88 ng/dL (ref 0.60–1.60)

## 2023-08-14 LAB — HEMOGLOBIN A1C: Hgb A1c MFr Bld: 6.2 % (ref 4.6–6.5)

## 2023-08-14 NOTE — Progress Notes (Unsigned)
 EP to read.

## 2023-08-14 NOTE — Patient Instructions (Addendum)
 Please go downstairs for labs and a urine test before you leave.  Our records show that you are overdue for Tdap (tetanus, diphtheria and pertussis) as well as Shingrix vaccines.  You can get these at your pharmacy or here by scheduling a nurse visit.  I am ordering an event monitor to evaluate for palpitations. You will receive a call to get this set up.

## 2023-08-14 NOTE — Progress Notes (Signed)
 Complete physical exam  Patient: Sherry Harper   DOB: 15-Nov-1970   52 y.o. Female  MRN: [id number]  Subjective:    Chief Complaint  Patient presents with   Annual Exam    Physical and also pt wants to talk about weight gain ( no referral to a nutrients or weight medication   She is here for a complete physical exam. OB/GYN is [facility] has an appt soon   Colonoscopy UTD  Sees dermatology   C/o 10 lb weight gain since last year. No changes in diet or activity level   LMP: 2021   Leg cramps for years. Used to be at night. Taking magnesium which helps.   Random palpitations lasting a few seconds 2-3 times per week. Feels like a flutter. No associated symptoms.   Rarely drinks alcohol.  No smoking or drug use   Married. 1 child.    Tdap, Shingrix overdue   Works in a Health and safety inspector Date Due   DTaP/Tdap/Td vaccine (1 - Tdap) Never done   Hepatitis B Vaccine (1 of 3 - 19+ 3-dose series) Never done   Zoster (Shingles) Vaccine (1 of 2) Never done   COVID-19 Vaccine (1 - 2024-25 season) Never done   Flu Shot  08/25/2023   Mammogram  05/21/2025   Pap with HPV screening  05/31/2027   Colon Cancer Screening  04/16/2029   Hepatitis C Screening  Completed   HIV Screening  Completed   HPV Vaccine  Aged Out   Meningitis B Vaccine  Aged Out    Wears seatbelt always, uses sunscreen, smoke detectors in home,  feels safe in home environment.  Depression screening:    08/14/2023    8:17 AM 07/13/2022    8:00 AM  Depression screen PHQ 2/9  Decreased Interest 0 0  Down, Depressed, Hopeless 0 0  PHQ - 2 Score 0 0   Anxiety Screening:     No data to display          Vision:Within last year and Dental: No current dental problems and Receives regular dental care  Patient Active Problem List   Diagnosis Date Noted   Weight gain 08/14/2023   Intermittent diarrhea 08/14/2023   Family history of diabetes mellitus in first degree relative  07/13/2022   Intermittent palpitations 07/13/2022   Urinary incontinence 07/13/2022   Hyperlipidemia 07/13/2022   Encounter for general adult medical examination with abnormal findings 07/13/2022   Past Medical History:  Diagnosis Date   Encounter for general adult medical examination with abnormal findings 07/13/2022   Hyperlipidemia 07/13/2022   History reviewed. No pertinent surgical history. Social History   Tobacco Use   Smoking status: Never   Smokeless tobacco: Never  Substance Use Topics   Alcohol use: Not Currently    Comment: Rarely - 1-2 drinks per year   Drug use: Never      Patient Care Team: Lendia Boby CROME, NP-C as PCP - General (Family Medicine) Rosalva Rexene, MD as Consulting Physician (Obstetrics and Gynecology)   Outpatient Medications Prior to Visit  Medication Sig   Cholecalciferol (VITAMIN D-3 PO) daily.   MAGNESIUM PO 125 mg daily.   neomycin -polymyxin-hydrocortisone  (CORTISPORIN) OTIC solution Place 4 drops into both ears 4 (four) times daily.   Omega 3 1000 MG CAPS 1 capsule Orally Once a day   No facility-administered medications prior to visit.    Review of Systems  Constitutional:  Negative for  chills, fever, malaise/fatigue and weight loss.  HENT:  Negative for congestion, ear pain, sinus pain and sore throat.   Eyes:  Negative for blurred vision, double vision and pain.  Respiratory:  Negative for cough, shortness of breath and wheezing.   Cardiovascular:  Positive for palpitations. Negative for chest pain, claudication and leg swelling.  Gastrointestinal:  Positive for diarrhea. Negative for abdominal pain, constipation, nausea and vomiting.       Intermittent   Genitourinary:  Negative for dysuria, frequency and urgency.  Musculoskeletal:  Negative for back pain, joint pain and myalgias.  Skin:  Negative for rash.  Neurological:  Negative for dizziness, tingling, focal weakness and headaches.  Psychiatric/Behavioral:  Negative for  depression. The patient is not nervous/anxious.        Objective:    BP 128/82   Pulse 65   Temp 98.1 F (36.7 C) (Temporal)   Ht 5' 7 (1.702 m)   Wt 165 lb 2 oz (74.9 kg)   SpO2 98%   BMI 25.86 kg/m  BP Readings from Last 3 Encounters:  08/14/23 128/82  07/13/22 122/76   Wt Readings from Last 3 Encounters:  08/14/23 165 lb 2 oz (74.9 kg)  07/13/22 155 lb (70.3 kg)    Physical Exam Constitutional:      General: She is not in acute distress.    Appearance: She is not ill-appearing.  HENT:     Right Ear: Tympanic membrane, ear canal and external ear normal.     Left Ear: Tympanic membrane, ear canal and external ear normal.     Nose: Nose normal.     Mouth/Throat:     Mouth: Mucous membranes are moist.     Pharynx: Oropharynx is clear.  Eyes:     Extraocular Movements: Extraocular movements intact.     Conjunctiva/sclera: Conjunctivae normal.     Pupils: Pupils are equal, round, and reactive to light.  Neck:     Thyroid : No thyroid  mass, thyromegaly or thyroid  tenderness.  Cardiovascular:     Rate and Rhythm: Normal rate and regular rhythm.     Pulses: Normal pulses.     Heart sounds: Normal heart sounds.  Pulmonary:     Effort: Pulmonary effort is normal.     Breath sounds: Normal breath sounds.  Abdominal:     General: Bowel sounds are normal.     Palpations: Abdomen is soft.     Tenderness: There is no abdominal tenderness. There is no right CVA tenderness, left CVA tenderness, guarding or rebound.  Musculoskeletal:        General: Normal range of motion.     Cervical back: Normal range of motion and neck supple. No tenderness.     Right lower leg: No edema.     Left lower leg: No edema.  Lymphadenopathy:     Cervical: No cervical adenopathy.  Skin:    General: Skin is warm and dry.     Findings: No lesion or rash.  Neurological:     General: No focal deficit present.     Mental Status: She is alert and oriented to person, place, and time.      Cranial Nerves: No cranial nerve deficit.     Sensory: No sensory deficit.     Motor: No weakness.     Gait: Gait normal.  Psychiatric:        Mood and Affect: Mood normal.        Behavior: Behavior normal.  Thought Content: Thought content normal.      Results for orders placed or performed in visit on 08/14/23  Urinalysis, Routine w reflex microscopic  Result Value Ref Range   Color, Urine YELLOW Yellow;Lt. Yellow;Straw;Dark Yellow;Amber;Green;Red;Brown   APPearance CLEAR Clear;Turbid;Slightly Cloudy;Cloudy   Specific Gravity, Urine 1.010 1.000 - 1.030   pH 7.0 5.0 - 8.0   Total Protein, Urine NEGATIVE Negative   Urine Glucose NEGATIVE Negative   Ketones, ur NEGATIVE Negative   Bilirubin Urine NEGATIVE Negative   Hgb urine dipstick NEGATIVE Negative   Urobilinogen, UA 0.2 0.0 - 1.0   Leukocytes,Ua NEGATIVE Negative   Nitrite NEGATIVE Negative   WBC, UA none seen 0-2/hpf   RBC / HPF none seen 0-2/hpf   Squamous Epithelial / HPF Rare(0-4/hpf) Rare(0-4/hpf)  Ferritin  Result Value Ref Range   Ferritin 50.6 10.0 - 291.0 ng/mL  Vitamin B12  Result Value Ref Range   Vitamin B-12 434 211 - 911 pg/mL  Magnesium  Result Value Ref Range   Magnesium 2.2 1.5 - 2.5 mg/dL  T4, free  Result Value Ref Range   Free T4 0.88 0.60 - 1.60 ng/dL  TSH  Result Value Ref Range   TSH 1.42 0.35 - 5.50 uIU/mL  Lipid panel  Result Value Ref Range   Cholesterol 239 (H) 0 - 200 mg/dL   Triglycerides 883.9 0.0 - 149.0 mg/dL   HDL 44.79 >60.99 mg/dL   VLDL 76.7 0.0 - 59.9 mg/dL   LDL Cholesterol 839 (H) 0 - 99 mg/dL   Total CHOL/HDL Ratio 4    NonHDL 183.62   Hemoglobin A1c  Result Value Ref Range   Hgb A1c MFr Bld 6.2 4.6 - 6.5 %  Comprehensive metabolic panel with GFR  Result Value Ref Range   Sodium 140 135 - 145 mEq/L   Potassium 4.0 3.5 - 5.1 mEq/L   Chloride 103 96 - 112 mEq/L   CO2 29 19 - 32 mEq/L   Glucose, Bld 102 (H) 70 - 99 mg/dL   BUN 12 6 - 23 mg/dL   Creatinine,  Ser 9.39 0.40 - 1.20 mg/dL   Total Bilirubin 0.5 0.2 - 1.2 mg/dL   Alkaline Phosphatase 61 39 - 117 U/L   AST 14 0 - 37 U/L   ALT 13 0 - 35 U/L   Total Protein 7.3 6.0 - 8.3 g/dL   Albumin 4.5 3.5 - 5.2 g/dL   GFR 898.52 >39.99 mL/min   Calcium 9.6 8.4 - 10.5 mg/dL  CBC with Differential/Platelet  Result Value Ref Range   WBC 3.9 (L) 4.0 - 10.5 K/uL   RBC 4.38 3.87 - 5.11 Mil/uL   Hemoglobin 13.8 12.0 - 15.0 g/dL   HCT 59.1 63.9 - 53.9 %   MCV 93.3 78.0 - 100.0 fl   MCHC 33.8 30.0 - 36.0 g/dL   RDW 86.3 88.4 - 84.4 %   Platelets 252.0 150.0 - 400.0 K/uL   Neutrophils Relative % 46.9 43.0 - 77.0 %   Lymphocytes Relative 45.3 12.0 - 46.0 %   Monocytes Relative 6.3 3.0 - 12.0 %   Eosinophils Relative 0.6 0.0 - 5.0 %   Basophils Relative 0.9 0.0 - 3.0 %   Neutro Abs 1.8 1.4 - 7.7 K/uL   Lymphs Abs 1.7 0.7 - 4.0 K/uL   Monocytes Absolute 0.2 0.1 - 1.0 K/uL   Eosinophils Absolute 0.0 0.0 - 0.7 K/uL   Basophils Absolute 0.0 0.0 - 0.1 K/uL      Assessment &  Plan:    Routine Health Maintenance and Physical Exam  Problem List Items Addressed This Visit     Encounter for general adult medical examination with abnormal findings - Primary   Relevant Orders   Urinalysis, Routine w reflex microscopic (Completed)   Family history of diabetes mellitus in first degree relative   Relevant Orders   Hemoglobin A1c (Completed)   Hyperlipidemia   Relevant Orders   Lipid panel (Completed)   Intermittent diarrhea   Relevant Orders   CBC with Differential/Platelet (Completed)   Comprehensive metabolic panel with GFR (Completed)   Intermittent palpitations   Relevant Orders   CBC with Differential/Platelet (Completed)   Comprehensive metabolic panel with GFR (Completed)   TSH (Completed)   T4, free (Completed)   Magnesium (Completed)   Vitamin B12 (Completed)   Ferritin (Completed)   LONG TERM MONITOR (3-14 DAYS)   EKG 12-Lead (Completed)   Weight gain   Relevant Orders   CBC with  Differential/Platelet (Completed)   Comprehensive metabolic panel with GFR (Completed)   TSH (Completed)   T4, free (Completed)   Other Visit Diagnoses       Muscle cramps       Relevant Orders   Magnesium (Completed)   Vitamin B12 (Completed)   Ferritin (Completed)   EKG 12-Lead (Completed)     Immunization counseling           Preventive health care reviewed.  Counseling on healthy lifestyle including diet and exercise.  Recommend regular dental and eye exams.  Immunizations reviewed.  Discussed safety. Discussed gradual weight gain. Recommend low carb diet and higher intensity exercise.   Palpitations- EKG shows NSR, rate 67, no Q waves or ischemia   Recommend event monitor for palpitations. Check labs including thyroid .  Intermittent muscle cramps- check for deficiencies. Hydrate.  Records show she is overdue for Shingrix and Tdap and can return here for a nurse visit or her pharmacy.   Return in about 1 year (around 08/13/2024).     Boby Mackintosh, NP-C

## 2023-08-29 DIAGNOSIS — Z6825 Body mass index (BMI) 25.0-25.9, adult: Secondary | ICD-10-CM | POA: Diagnosis not present

## 2023-08-29 DIAGNOSIS — Z713 Dietary counseling and surveillance: Secondary | ICD-10-CM | POA: Diagnosis not present

## 2023-09-04 DIAGNOSIS — Z01419 Encounter for gynecological examination (general) (routine) without abnormal findings: Secondary | ICD-10-CM | POA: Diagnosis not present

## 2023-09-05 DIAGNOSIS — R002 Palpitations: Secondary | ICD-10-CM | POA: Diagnosis not present

## 2023-09-25 DIAGNOSIS — R7309 Other abnormal glucose: Secondary | ICD-10-CM | POA: Diagnosis not present

## 2023-09-26 DIAGNOSIS — Z6826 Body mass index (BMI) 26.0-26.9, adult: Secondary | ICD-10-CM | POA: Diagnosis not present

## 2023-09-26 DIAGNOSIS — Z713 Dietary counseling and surveillance: Secondary | ICD-10-CM | POA: Diagnosis not present

## 2023-10-25 DIAGNOSIS — R7309 Other abnormal glucose: Secondary | ICD-10-CM | POA: Diagnosis not present

## 2023-11-08 NOTE — Progress Notes (Signed)
 Cardiology Office Note:    Date:  11/13/2023   ID:  ZAMYAH WIESMAN, DOB 13-May-1970, MRN [id number]  PCP:  Lendia Boby CROME, NP-C   [facility] Providers Cardiologist:  None     Referring MD: Lendia Boby CROME, NP-C   Chief Complaint  Patient presents with   Palpitations    History of Present Illness:    Sherry Harper is a 53 y.o. female is seen at the request of Boby Lendia NP-C for evaluation of palpitations/SVT. She has a history of HLD. She reports that for 2 years she has noted some fluttering in her chest. Typically lasts seconds. She cannot identify any triggers. Notices it more in the evening when she is sitting watching TV. It doesn't really concern her much. No chest pain, dizziness, SOB. Otherwise in excellent health.   Past Medical History:  Diagnosis Date   Encounter for general adult medical examination with abnormal findings 07/13/2022   Hyperlipidemia 07/13/2022    History reviewed. No pertinent surgical history.  Current Medications: Current Meds  Medication Sig   Cholecalciferol (VITAMIN D-3 PO) daily.   MAGNESIUM PO 125 mg daily.   Omega 3 1000 MG CAPS 1 capsule Orally Once a day     Allergies:   Patient has no known allergies.   Social History   Socioeconomic History   Marital status: Married    Spouse name: Not on file   Number of children: 1   Years of education: Not on file   Highest education level: Bachelor's degree (e.g., BA, AB, BS)  Occupational History   Not on file  Tobacco Use   Smoking status: Never   Smokeless tobacco: Never  Substance and Sexual Activity   Alcohol use: Not Currently    Comment: Rarely - 1-2 drinks per year   Drug use: Never   Sexual activity: Not Currently    Birth control/protection: Post-menopausal  Other Topics Concern   Not on file  Social History Narrative   Not on file   Social Drivers of Health   Financial Resource Strain: Low Risk  (08/13/2023)   Overall Financial Resource Strain  (CARDIA)    Difficulty of Paying Living Expenses: Not hard at all  Food Insecurity: No Food Insecurity (08/13/2023)   Hunger Vital Sign    Worried About Running Out of Food in the Last Year: Never true    Ran Out of Food in the Last Year: Never true  Transportation Needs: No Transportation Needs (08/13/2023)   PRAPARE - Administrator, Civil Service (Medical): No    Lack of Transportation (Non-Medical): No  Physical Activity: Insufficiently Active (08/13/2023)   Exercise Vital Sign    Days of Exercise per Week: 4 days    Minutes of Exercise per Session: 20 min  Stress: No Stress Concern Present (08/13/2023)   [employer] of Occupational Health - Occupational Stress Questionnaire    Feeling of Stress: Only a little  Social Connections: Socially Isolated (08/13/2023)   Social Connection and Isolation Panel    Frequency of Communication with Friends and Family: Once a week    Frequency of Social Gatherings with Friends and Family: Once a week    Attends Religious Services: Never    Database administrator or Organizations: No    Attends Engineer, structural: Not on file    Marital Status: Married     Family History: The patient's family history includes Arthritis in her mother; Breast cancer (age of  onset: 16) in her mother; Cancer in her mother; Diabetes in her father; Hearing loss in her mother; Heart attack in her maternal grandfather; Hyperlipidemia in her father; Hypertension in her father.  ROS:   Please see the history of present illness.     All other systems reviewed and are negative.  EKGs/Labs/Other Studies Reviewed:    The following studies were reviewed today: Event monitor 09/05/23: Study Highlights Show Result ComparisonHR 44 to 211, average 69. 21 nonsustained SVT, longest 15 seconds with an average rate of 153 bpm. Rare supraventricular and ventricular ectopy. No sustained arrhythmias. No atrial fibrillation. Symptom trigger episodes  corresponds to nonsustained SVT.   Cardiology consult placed based on protocol for symptomatic SVT.   EKG Interpretation Date/Time:  Monday November 13 2023 15:32:48 EDT Ventricular Rate:  65 PR Interval:  144 QRS Duration:  78 QT Interval:  394 QTC Calculation: 409 R Axis:   56  Text Interpretation: Normal sinus rhythm Normal ECG No previous ECGs available Confirmed by [place], Rhyse Loux [phone]) on 11/13/2023 3:34:20 PM   EKG Interpretation Date/Time:  Monday November 13 2023 15:32:48 EDT Ventricular Rate:  65 PR Interval:  144 QRS Duration:  78 QT Interval:  394 QTC Calculation: 409 R Axis:   56  Text Interpretation: Normal sinus rhythm Normal ECG No previous ECGs available Confirmed by [place], Janit Cutter [phone]) on 11/13/2023 3:34:20 PM   Recent Labs: 08/14/2023: ALT 13; BUN 12; Creatinine, Ser 0.60; Hemoglobin 13.8; Magnesium 2.2; Platelets 252.0; Potassium 4.0; Sodium 140; TSH 1.42  Recent Lipid Panel    Component Value Date/Time   CHOL 239 (H) 08/14/2023 0927   TRIG 116.0 08/14/2023 0927   HDL 55.20 08/14/2023 0927   CHOLHDL 4 08/14/2023 0927   VLDL 23.2 08/14/2023 0927   LDLCALC 160 (H) 08/14/2023 0927     Risk Assessment/Calculations:                Physical Exam:    VS:  BP 113/73 (BP Location: Left Arm, Patient Position: Sitting, Cuff Size: Normal)   Pulse 73   Ht 5' 7 (1.702 m)   Wt 168 lb 9.6 oz (76.5 kg)   SpO2 99%   BMI 26.41 kg/m     Wt Readings from Last 3 Encounters:  11/13/23 168 lb 9.6 oz (76.5 kg)  08/14/23 165 lb 2 oz (74.9 kg)  07/13/22 155 lb (70.3 kg)     GEN:  Well nourished, well developed in no acute distress HEENT: Normal NECK: No JVD; No carotid bruits LYMPHATICS: No lymphadenopathy CARDIAC: RRR, no murmurs, rubs, gallops RESPIRATORY:  Clear to auscultation without rales, wheezing or rhonchi  ABDOMEN: Soft, non-tender, non-distended MUSCULOSKELETAL:  No edema; No deformity  SKIN: Warm and dry NEUROLOGIC:  Alert and oriented x  3 PSYCHIATRIC:  Normal affect   ASSESSMENT:    1. Intermittent palpitations   2. PAT (paroxysmal atrial tachycardia)    PLAN:    In order of problems listed above:  PAT. Brief episodes noted on monitor. Nothing sustained. Minor symptoms. Normal cardiac exam, Ecg and labs. Reassured her. At this time symptoms are not bad enough where she would like to take a medication. If her symptoms do progress could try low dose beta blocker or diltiazem. She is comfortable with continued monitoring at this point. No additional cardiac evaluation needed. Follow up PRN           Medication Adjustments/Labs and Tests Ordered: Current medicines are reviewed at length with the patient today.  Concerns  regarding medicines are outlined above.  Orders Placed This Encounter  Procedures   EKG 12-Lead   No orders of the defined types were placed in this encounter.   Patient Instructions  Medication Instructions:  Continue same medications *If you need a refill on your cardiac medications before your next appointment, please call your pharmacy*  Lab Work: None ordered  Testing/Procedures: None ordered  Follow-Up: At [facility], you and your health needs are our priority.  As part of our continuing mission to provide you with exceptional heart care, our providers are all part of one team.  This team includes your primary Cardiologist (physician) and Advanced Practice Providers or APPs (Physician Assistants and Nurse Practitioners) who all work together to provide you with the care you need, when you need it.  Your next appointment: As Needed    Provider:  Dr.Herberto Ledwell   We recommend signing up for the patient portal called MyChart.  Sign up information is provided on this After Visit Summary.  MyChart is used to connect with patients for Virtual Visits (Telemedicine).  Patients are able to view lab/test results, encounter notes, upcoming appointments, etc.  Non-urgent messages can be  sent to your provider as well.   To learn more about what you can do with MyChart, go to [email or web address].       Signed, Zebulin Siegel [place], MD  11/13/2023 3:47 PM    [facility]

## 2023-11-13 ENCOUNTER — Ambulatory Visit: Attending: Cardiology | Admitting: Cardiology

## 2023-11-13 ENCOUNTER — Encounter: Payer: Self-pay | Admitting: Cardiology

## 2023-11-13 VITALS — BP 113/73 | HR 73 | Ht 67.0 in | Wt 168.6 lb

## 2023-11-13 DIAGNOSIS — R002 Palpitations: Secondary | ICD-10-CM | POA: Diagnosis not present

## 2023-11-13 DIAGNOSIS — I4719 Other supraventricular tachycardia: Secondary | ICD-10-CM | POA: Diagnosis not present

## 2023-11-13 NOTE — Patient Instructions (Signed)
 Medication Instructions:  Continue same medications *If you need a refill on your cardiac medications before your next appointment, please call your pharmacy*  Lab Work: None ordered  Testing/Procedures: None ordered  Follow-Up: At [facility], you and your health needs are our priority.  As part of our continuing mission to provide you with exceptional heart care, our providers are all part of one team.  This team includes your primary Cardiologist (physician) and Advanced Practice Providers or APPs (Physician Assistants and Nurse Practitioners) who all work together to provide you with the care you need, when you need it.  Your next appointment:  As Needed    Provider:  Dr.Jordan    We recommend signing up for the patient portal called "MyChart".  Sign up information is provided on this After Visit Summary.  MyChart is used to connect with patients for Virtual Visits (Telemedicine).  Patients are able to view lab/test results, encounter notes, upcoming appointments, etc.  Non-urgent messages can be sent to your provider as well.   To learn more about what you can do with MyChart, go to [email or web address].

## 2023-11-25 DIAGNOSIS — R7309 Other abnormal glucose: Secondary | ICD-10-CM | POA: Diagnosis not present

## 2023-12-28 DIAGNOSIS — Z6826 Body mass index (BMI) 26.0-26.9, adult: Secondary | ICD-10-CM | POA: Diagnosis not present

## 2023-12-28 DIAGNOSIS — Z713 Dietary counseling and surveillance: Secondary | ICD-10-CM | POA: Diagnosis not present

## 2024-01-28 IMAGING — MG MM DIGITAL SCREENING BILAT W/ TOMO AND CAD
8 series · 9 of 24 positions shown · non-contrast
Comparison: Previous exam(s).

CLINICAL DATA: Screening.

EXAM:
DIGITAL SCREENING BILATERAL MAMMOGRAM WITH TOMOSYNTHESIS AND CAD
TECHNIQUE: Bilateral screening digital craniocaudal and mediolateral oblique
mammograms were obtained. Bilateral screening digital breast
tomosynthesis was performed. The images were evaluated with
computer-aided detection.

[L MLO synth-2D]
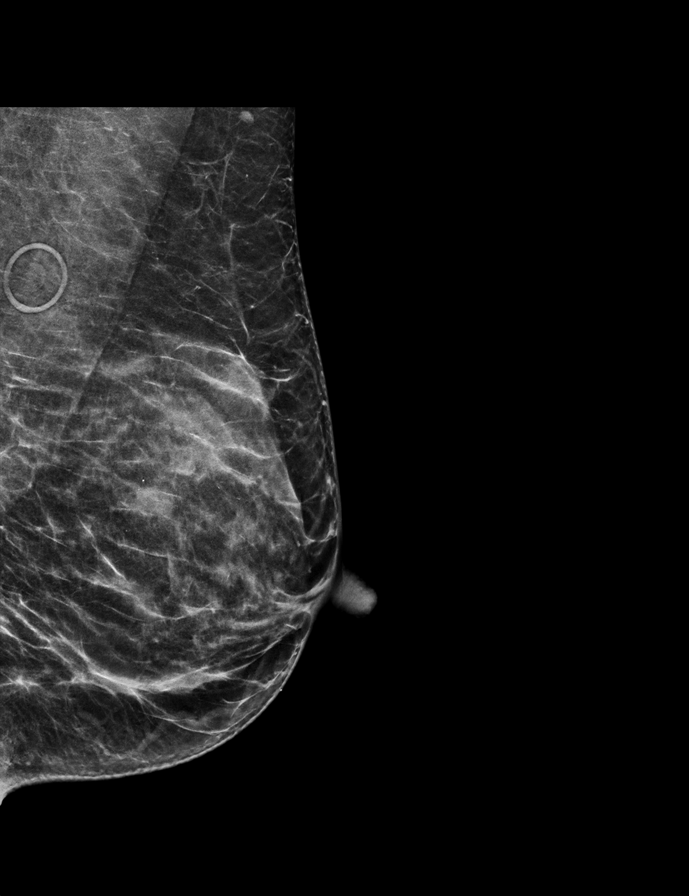

[R CC synth-2D]
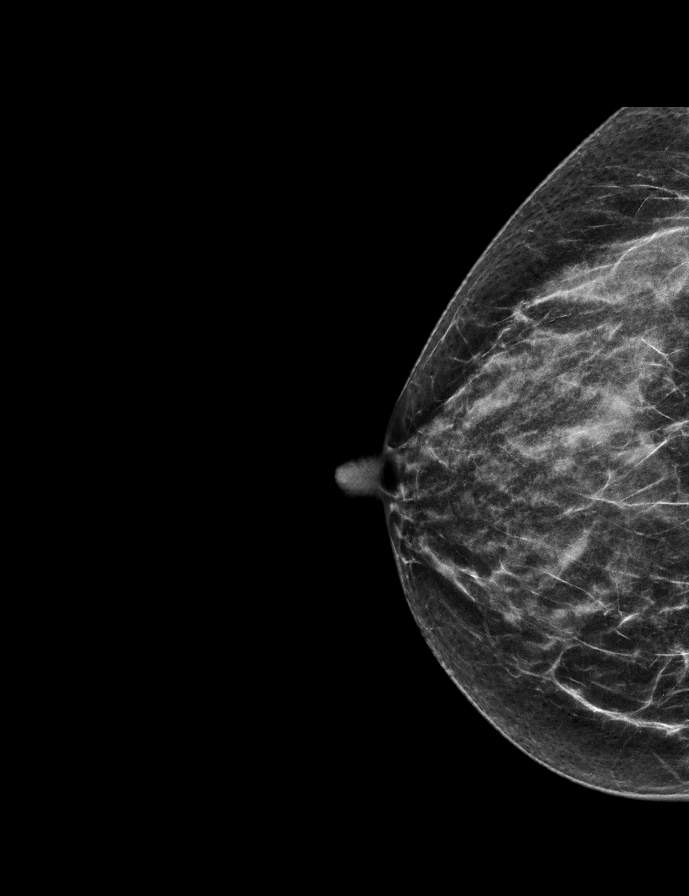

[R MLO synth-2D]
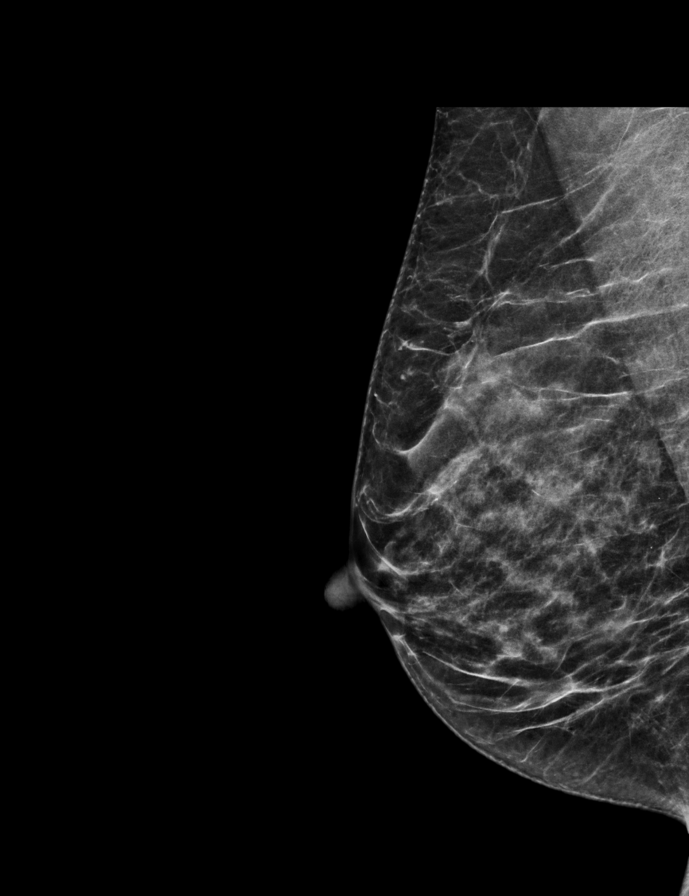

[L CC synth-2D]
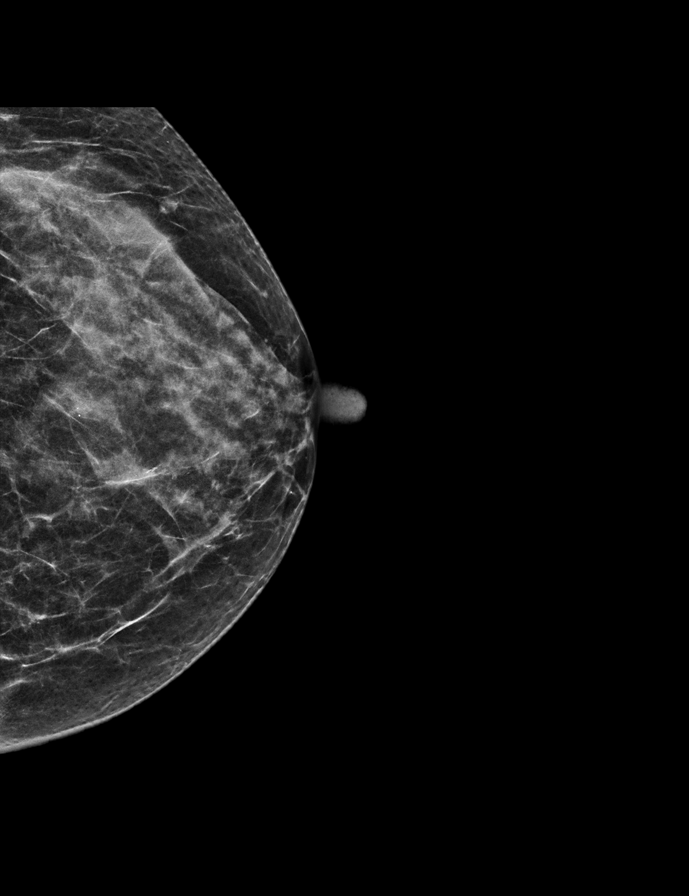

[L MLO tomo · 2 of 53 frames shown]
[frame 18/53]
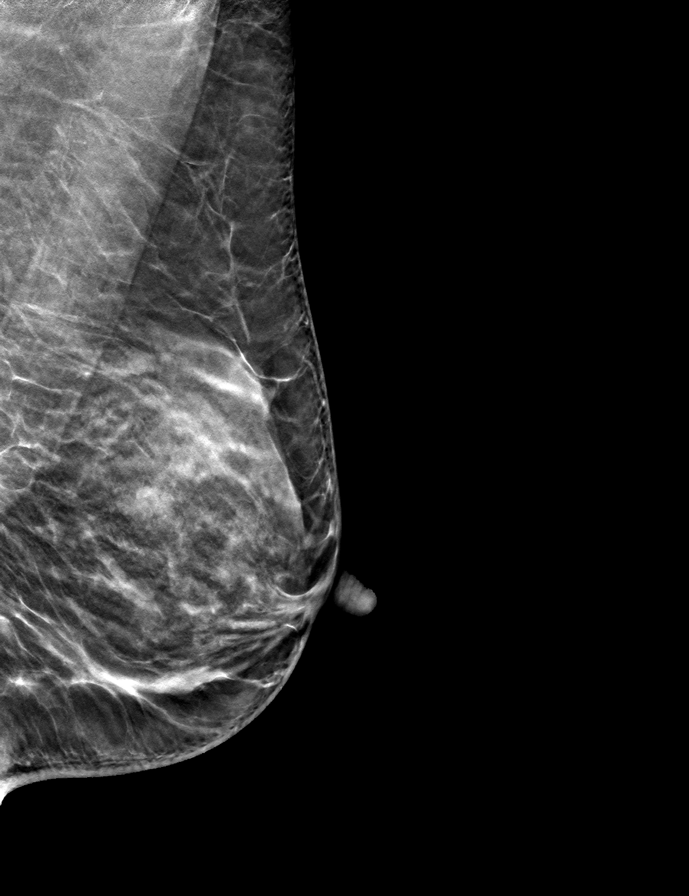
[frame 27/53]
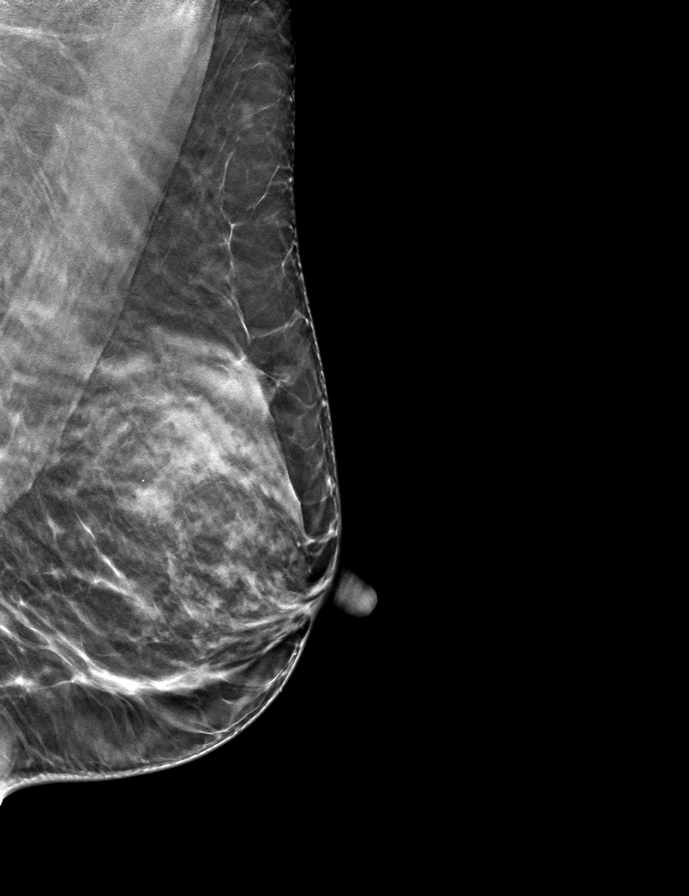

[R MLO tomo · tomo slice 26/51.0]
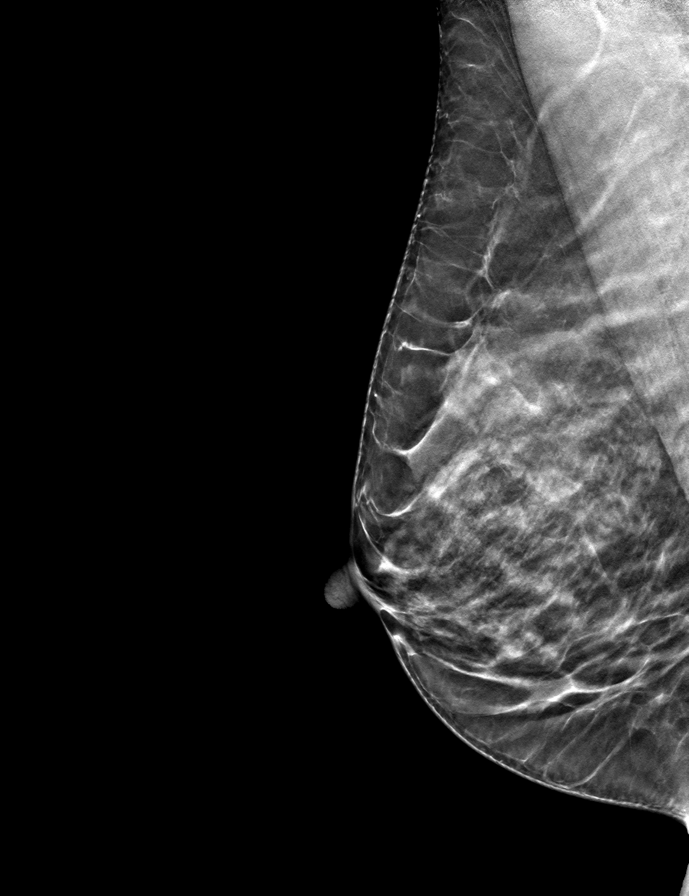

[L CC tomo · tomo slice 24/47.0]
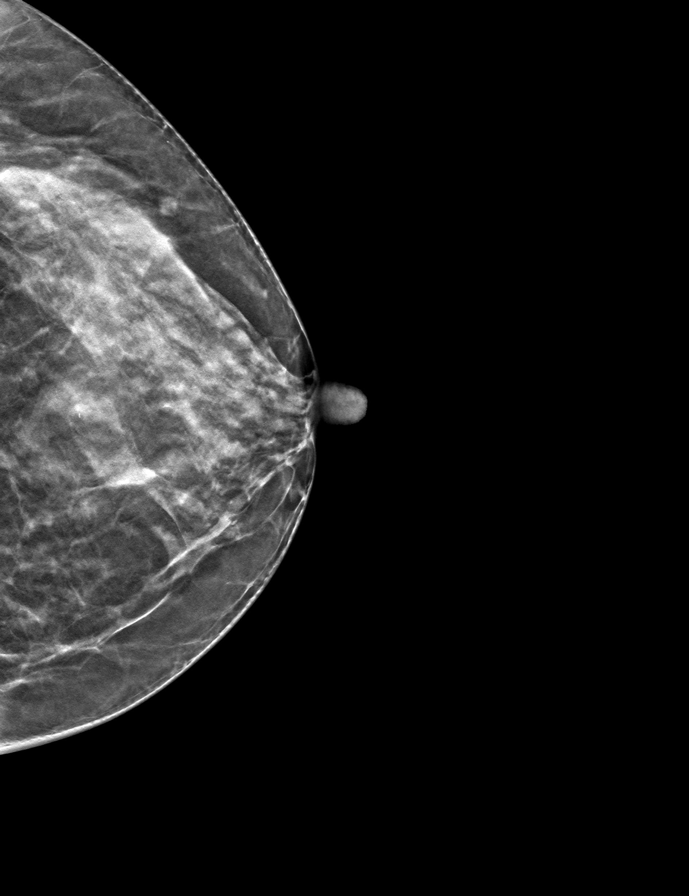

[R CC tomo · tomo slice 25/50.0]
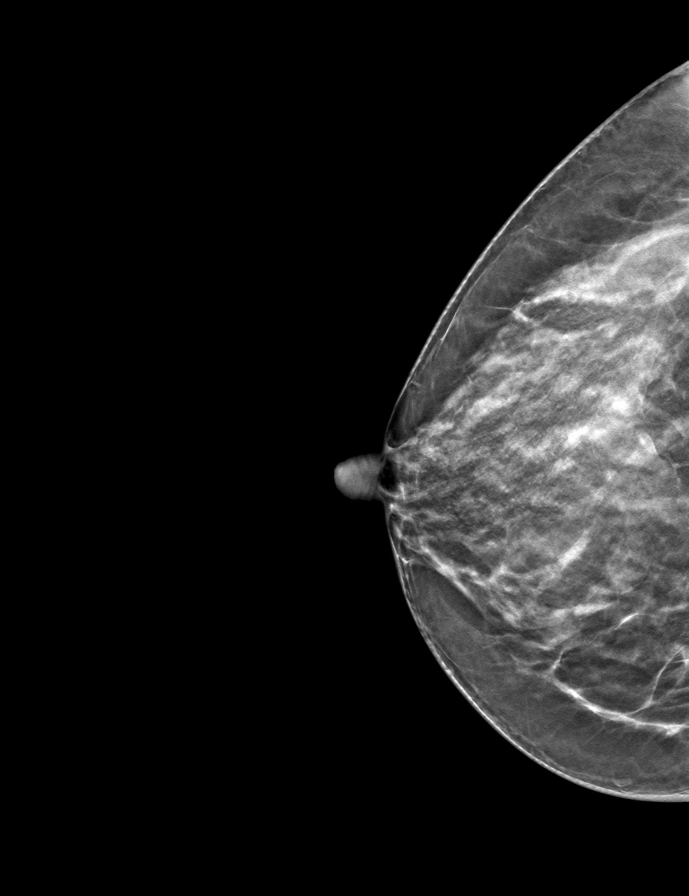

[9 of 24 positions shown; findings below may reference images not displayed]

ACR Breast Density Category c: The breast tissue is heterogeneously
dense, which may obscure small masses.
FINDINGS: There are no findings suspicious for malignancy.
IMPRESSION: No mammographic evidence of malignancy. A result letter of this
screening mammogram will be mailed directly to the patient.

RECOMMENDATION:
Screening mammogram in one year. (Code:Q3-W-BC3)

BI-RADS CATEGORY  1: Negative.
